# Patient Record
Sex: Male | Born: 1960 | Race: White | Hispanic: No | Marital: Married | Smoking: Never smoker
Health system: Southern US, Community
[De-identification: ages and names within clinical notes are randomized; demographics above are authoritative.]

## PROBLEM LIST (undated history)

## (undated) DIAGNOSIS — I7090 Unspecified atherosclerosis: Secondary | ICD-10-CM

## (undated) DIAGNOSIS — E7800 Pure hypercholesterolemia, unspecified: Secondary | ICD-10-CM

## (undated) HISTORY — DX: Unspecified atherosclerosis: I70.90

## (undated) HISTORY — PX: CERVICAL FUSION: SHX112

---

## 2009-05-24 ENCOUNTER — Ambulatory Visit: Payer: Self-pay | Admitting: Unknown Physician Specialty

## 2011-01-26 ENCOUNTER — Ambulatory Visit: Payer: Self-pay | Admitting: Internal Medicine

## 2017-01-12 ENCOUNTER — Ambulatory Visit
Admission: EM | Admit: 2017-01-12 | Discharge: 2017-01-12 | Disposition: A | Discharge status: Home / Self Care | Payer: BLUE CROSS/BLUE SHIELD | Attending: Emergency Medicine | Admitting: Emergency Medicine

## 2017-01-12 ENCOUNTER — Other Ambulatory Visit: Payer: Self-pay

## 2017-01-12 ENCOUNTER — Ambulatory Visit (INDEPENDENT_AMBULATORY_CARE_PROVIDER_SITE_OTHER): Payer: BLUE CROSS/BLUE SHIELD

## 2017-01-12 DIAGNOSIS — S92919A Unspecified fracture of unspecified toe(s), initial encounter for closed fracture: Secondary | ICD-10-CM

## 2017-01-12 DIAGNOSIS — S92511A Displaced fracture of proximal phalanx of right lesser toe(s), initial encounter for closed fracture: Secondary | ICD-10-CM | POA: Diagnosis not present

## 2017-01-12 DIAGNOSIS — S90222A Contusion of left lesser toe(s) with damage to nail, initial encounter: Secondary | ICD-10-CM

## 2017-01-12 HISTORY — DX: Pure hypercholesterolemia, unspecified: E78.00

## 2017-01-12 MED ORDER — HYDROCODONE-ACETAMINOPHEN 5-325 MG PO TABS: 1.0000 | ORAL_TABLET | Freq: Four times a day (QID) | ORAL | 0 refills | Status: DC | PRN

## 2017-01-12 MED ORDER — IBUPROFEN 600 MG PO TABS: 600.0000 mg | ORAL_TABLET | Freq: Four times a day (QID) | ORAL | 0 refills | Status: DC | PRN

## 2017-01-12 NOTE — ED Provider Notes (Signed)
HPI  SUBJECTIVE:  Dylan Holt is a 56 y.o. male who presents with right second toe pain, swelling, bruising after dropping a limb on it.  States that he was wearing boots.  He reports dull, constant, achy throbbing pain.  He reports deformity, swelling, but states the skin is intact.  He reports a subungual hematoma.  No numbness or tingling.  Symptoms are worse with walking, palpation, no alleviating factors.  He has not tried anything for this.  Past medical history negative for diabetes, hypertension. PMD: Marina GoodellFeldpausch, Dale E, MD   Past Medical History:  Diagnosis Date  . High cholesterol     Past Surgical History:  Procedure Laterality Date  . CERVICAL FUSION      History reviewed. No pertinent family history.  Social History   Tobacco Use  . Smoking status: Never Smoker  . Smokeless tobacco: Never Used  Substance Use Topics  . Alcohol use: Yes    Comment: social  . Drug use: No    No current facility-administered medications for this encounter.   Current Outpatient Medications:  .  rosuvastatin (CRESTOR) 10 MG tablet, Take 10 mg by mouth daily., Disp: , Rfl:  .  HYDROcodone-acetaminophen (NORCO/VICODIN) 5-325 MG tablet, Take 1-2 tablets by mouth every 6 (six) hours as needed for moderate pain., Disp: 20 tablet, Rfl: 0 .  ibuprofen (ADVIL,MOTRIN) 600 MG tablet, Take 1 tablet (600 mg total) by mouth every 6 (six) hours as needed., Disp: 30 tablet, Rfl: 0  No Known Allergies   ROS  As noted in HPI.   Physical Exam  BP 139/89 (BP Location: Right Arm)   Pulse 70   Temp 98.2 F (36.8 C) (Oral)   Resp 16   Ht 5\' 6"  (1.676 m)   Wt 140 lb (63.5 kg)   SpO2 99%   BMI 22.60 kg/m   Constitutional: Well developed, well nourished, no acute distress Eyes:  EOMI, conjunctiva normal bilaterally HENT: Normocephalic, atraumatic,mucus membranes moist Respiratory: Normal inspiratory effort Cardiovascular: Normal rate GI: nondistended skin: No rash, skin  intact Musculoskeletal: Positive tenderness distal phalanx of right second toe.  Positive subungual hematoma.  Positive bruising.  No limitation of motion.  Sensation grossly intact.  Cap refill less than 2 seconds.  See picture. no Rotational deformity when compared to other toe.   Neurologic: Alert & oriented x 3, no focal neuro deficits Psychiatric: Speech and behavior appropriate   ED Course   Medications - No data to display  Orders Placed This Encounter  Procedures  . DG Toe 2nd Right    Standing Status:   Standing    Number of Occurrences:   1    Order Specific Question:   Reason for Exam (SYMPTOM  OR DIAGNOSIS REQUIRED)    Answer:   pain  . DG Toe 2nd Right    Standing Status:   Standing    Number of Occurrences:   1    Order Specific Question:   Reason for Exam (SYMPTOM  OR DIAGNOSIS REQUIRED)    Answer:   eval for reduction  . Ambulatory referral to Podiatry    Referral Priority:   Urgent    Referral Type:   Consultation    Referral Reason:   Specialty Services Required    Requested Specialty:   Podiatry    Number of Visits Requested:   1  . Post op shoe    Standing Status:   Standing    Number of Occurrences:   1  Order Specific Question:   Laterality    Answer:   Right  . Buddy tape toes    Standing Status:   Standing    Number of Occurrences:   1    Order Specific Question:   Laterality    Answer:   Right    No results found for this or any previous visit (from the past 24 hour(s)). Dg Toe 2nd Right  Result Date: 01/12/2017 CLINICAL DATA:  Postreduction images of the fracture right second toe. EXAM: RIGHT SECOND TOE COMPARISON:  None. FINDINGS: The mild distal and dorsal displacement of the fracture of the distal phalanx of the right second toe is without change from the pre reduction images. IMPRESSION: No significant reduction of the mildly displaced fracture across the base of the distal tuft of the right second toe distal phalanx. Electronically Signed    By: Amie Portlandavid  Ormond M.D.   On: 01/12/2017 12:27   Dg Toe 2nd Right  Result Date: 01/12/2017 CLINICAL DATA:  Injury to toe this morning. Pain at distal phalanx of second toe. EXAM: RIGHT SECOND TOE COMPARISON:  None. FINDINGS: There is a transverse fracture across the distal phalanx of the right second toe, at the base of the distal tuft. Fracture is mildly displaced, distally by 2 mm and dorsally by a 1-2 mm. No other fractures.  The joints are normally spaced and aligned. IMPRESSION: Fracture of the distal phalanx of the right second toe. Electronically Signed   By: Amie Portlandavid  Ormond M.D.   On: 01/12/2017 11:25    ED Clinical Impression  Closed displaced fracture of distal phalanx of toe  Subungual hematoma of toe of left foot, initial encounter   ED Assessment/Plan  Reviewed imaging independently.  Displaced fracture of distal phalanx of right second toe.  See radiology report for full details.  Procedure note: Distracted distal phalanx and pressed toe down. Appeared to have good alignment post reduction.  Refill less than 2 seconds post procedure.  Obtaining post reduction films.   Post reduction films obtained.  No significant reduction.     [place] Narcotic database reviewed for this patient, and feel that the risk/benefit ratio today is favorable for proceeding with a prescription for controlled substance. No  Opiate prescriptions in 2 years.  discussed case with Dr. Ether GriffinsFowler, podiatry on call.  Advised buddy taping, stiff soled shoe, placed referral to podiatry.  Patient is to call the office on Monday and they will work him in. This may need yo be pinned.   Discussed imaging, MDM, plan and followup with patient. patient agrees with plan.   Meds ordered this encounter  Medications  . ibuprofen (ADVIL,MOTRIN) 600 MG tablet    Sig: Take 1 tablet (600 mg total) by mouth every 6 (six) hours as needed.    Dispense:  30 tablet    Refill:  0  . HYDROcodone-acetaminophen (NORCO/VICODIN) 5-325  MG tablet    Sig: Take 1-2 tablets by mouth every 6 (six) hours as needed for moderate pain.    Dispense:  20 tablet    Refill:  0    *This clinic note was created using Scientist, clinical (histocompatibility and immunogenetics)Dragon dictation software. Therefore, there may be occasional mistakes despite careful proofreading.   ?    Domenick GongMortenson, Dinero Chavira, MD 01/13/17 [phone]

## 2017-01-12 NOTE — Discharge Instructions (Signed)
Keep the toe buddy taped.  Wear the stiff soled shoe for protection and for comfort.  600 mg ibuprofen with 1 g of Tylenol 3-4 times a day as needed for pain.  Ice, elevate.  Norco for severe pain only.  Call Dr. Irene LimboFowler's office on Monday, he will wear nightly.

## 2017-01-12 NOTE — ED Triage Notes (Signed)
Pt with right 2nd toe pain after a limb fell on it while cutting branches this a[place]m. At [employer].

## 2017-08-10 ENCOUNTER — Other Ambulatory Visit: Payer: Self-pay

## 2017-08-10 ENCOUNTER — Ambulatory Visit
Admission: EM | Admit: 2017-08-10 | Discharge: 2017-08-10 | Disposition: A | Discharge status: Home / Self Care | Payer: BLUE CROSS/BLUE SHIELD | Attending: Family Medicine | Admitting: Family Medicine

## 2017-08-10 DIAGNOSIS — R05 Cough: Secondary | ICD-10-CM | POA: Diagnosis not present

## 2017-08-10 DIAGNOSIS — J0100 Acute maxillary sinusitis, unspecified: Secondary | ICD-10-CM | POA: Diagnosis not present

## 2017-08-10 DIAGNOSIS — R059 Cough, unspecified: Secondary | ICD-10-CM

## 2017-08-10 MED ORDER — BENZONATATE 100 MG PO CAPS: 100.0000 mg | ORAL_CAPSULE | Freq: Three times a day (TID) | ORAL | 0 refills | Status: DC | PRN

## 2017-08-10 MED ORDER — AMOXICILLIN-POT CLAVULANATE 875-125 MG PO TABS: 1.0000 | ORAL_TABLET | Freq: Two times a day (BID) | ORAL | 0 refills | Status: DC

## 2017-08-10 NOTE — Discharge Instructions (Signed)
Take medication as prescribed. Rest. Drink plenty of fluids.  ° °Follow up with your primary care physician this week as needed. Return to Urgent care for new or worsening concerns.  ° °

## 2017-08-10 NOTE — ED Triage Notes (Signed)
Patient complains of cough, congestion, sinus pain and pressure, ear pressure x 2 weeks. Patient states that he originally improved and then worsened again.

## 2017-08-10 NOTE — ED Provider Notes (Signed)
[facility] ____________________________________________  Time seen: Approximately 10:07 AM  I have reviewed the triage vital signs and the nursing notes.   HISTORY  Chief Complaint Cough  HPI Dylan Holt is a 57 y.o. male presenting for evaluation of 2.5 weeks of runny nose, nasal congestion and intermittent cough.  States he has been having some issues with seasonal allergies and has been taking Flonase and Claritin.  States has had increased nasal congestion with accompanying sinus pressure over the last 2 and half weeks.  States symptoms started to improve last week but reports of the last several days pressure has increased again and having postnasal drainage with intermittent cough.  Has not been taking any other over-the-counter medications.  Denies to eat and drink well.  Denies known fevers. Denies known sick contacts.  Reports otherwise feels well. Denies chest pain, shortness of breath, extremity pain, extremity swelling or rash. Denies recent sickness. Denies recent antibiotic use.    Past Medical History:  Diagnosis Date  . High cholesterol     There are no active problems to display for this patient.   Past Surgical History:  Procedure Laterality Date  . CERVICAL FUSION       No current facility-administered medications for this encounter.   Current Outpatient Medications:  .  ibuprofen (ADVIL,MOTRIN) 600 MG tablet, Take 1 tablet (600 mg total) by mouth every 6 (six) hours as needed., Disp: 30 tablet, Rfl: 0 .  rosuvastatin (CRESTOR) 10 MG tablet, Take 10 mg by mouth daily., Disp: , Rfl:  .  amoxicillin-clavulanate (AUGMENTIN) 875-125 MG tablet, Take 1 tablet by mouth every 12 (twelve) hours., Disp: 20 tablet, Rfl: 0 .  benzonatate (TESSALON PERLES) 100 MG capsule, Take 1 capsule (100 mg total) by mouth 3 (three) times daily as needed., Disp: 15 capsule, Rfl: 0  Allergies Patient has no known allergies.  Family History  Problem Relation Age of  Onset  . Heart attack Father   . Lung cancer Father     Social History Social History   Tobacco Use  . Smoking status: Never Smoker  . Smokeless tobacco: Never Used  Substance Use Topics  . Alcohol use: Yes    Comment: social  . Drug use: No    Review of Systems Constitutional: No fever/chills ENT: No sore throat. As above.  Cardiovascular: Denies chest pain. Respiratory: Denies shortness of breath. Gastrointestinal: No abdominal pain.  Musculoskeletal: Negative for back pain. Skin: Negative for rash.  ____________________________________________   PHYSICAL EXAM:  VITAL SIGNS: ED Triage Vitals  Enc Vitals Group     BP 08/10/17 0931 113/81     Pulse Rate 08/10/17 0931 70     Resp 08/10/17 0931 16     Temp 08/10/17 0931 98.6 F (37 C)     Temp Source 08/10/17 0931 Oral     SpO2 08/10/17 0931 99 %     Weight 08/10/17 0928 145 lb (65.8 kg)     Height 08/10/17 0928 5\' 6"  (1.676 m)     Head Circumference --      Peak Flow --      Pain Score 08/10/17 0928 3     Pain Loc --      Pain Edu? --      Excl. in GC? --     Constitutional: Alert and oriented. Well appearing and in no acute distress. Eyes: Conjunctivae are normal. Head: Atraumatic.Mild to moderate tenderness to palpation bilateral frontal and maxillary sinuses. No swelling. No erythema.  Ears:Left: Nontender, normal canal, no erythema, mild effusion present, otherwise normal TM.  Right: Nontender, normal canal, no erythema, normal TM.  Nose:Nasal congestion   Mouth/Throat: Mucous membranes are moist. Oropharynx non-erythematous. No tonsillar swelling or exudate.  Neck: No stridor.  No cervical spine tenderness to palpation. Hematological/Lymphatic/Immunilogical: No cervical lymphadenopathy. Cardiovascular: Normal rate, regular rhythm. Grossly normal heart sounds. Good peripheral circulation. Respiratory: Normal respiratory effort.  No retractions. No wheezes, rales or rhonchi. Good air movement.    Musculoskeletal: Steady gait.  Neurologic:  Normal speech and language. No gait instability. Skin:  Skin is warm, dry and intact. No rash noted. Psychiatric: Mood and affect are normal. Speech and behavior are normal.  ___________________________________________   LABS (all labs ordered are listed, but only abnormal results are displayed)  Labs Reviewed - No data to display   PROCEDURES Procedures   INITIAL IMPRESSION / ASSESSMENT AND PLAN / ED COURSE  Pertinent labs & imaging results that were available during my care of the patient were reviewed by me and considered in my medical decision making (see chart for details).  Well-appearing patient.  No acute distress.  Suspect recent viral upper respiratory infection versus allergic rhinitis with secondary sinusitis and cough.  Will treat with oral Augmentin and PRN Tessalon Perles.  Encourage rest, fluids, supportive care, continue Claritin and Flonase.  Discussed in detail with patient if continues to have left ear hearing changes, follow-up with ENT recommended.Discussed indication, risks and benefits of medications with patient.  Discussed follow up with Primary care physician this week. Discussed follow up and return parameters including no resolution or any worsening concerns. Patient verbalized understanding and agreed to plan.   ____________________________________________   FINAL CLINICAL IMPRESSION(S) / ED DIAGNOSES  Final diagnoses:  Acute maxillary sinusitis, recurrence not specified  Cough     ED Discharge Orders        Ordered    amoxicillin-clavulanate (AUGMENTIN) 875-125 MG tablet  Every 12 hours     08/10/17 0957    benzonatate (TESSALON PERLES) 100 MG capsule  3 times daily PRN     08/10/17 0959       Note: This dictation was prepared with Dragon dictation along with smaller phrase technology. Any transcriptional errors that result from this process are unintentional.         Renford DillsMiller, Andras Grunewald,  NP 08/10/17 1032

## 2017-11-04 ENCOUNTER — Other Ambulatory Visit: Payer: Self-pay | Admitting: Otolaryngology

## 2017-11-04 DIAGNOSIS — H90A22 Sensorineural hearing loss, unilateral, left ear, with restricted hearing on the contralateral side: Secondary | ICD-10-CM

## 2017-11-08 ENCOUNTER — Ambulatory Visit
Admission: RE | Admit: 2017-11-08 | Discharge: 2017-11-08 | Disposition: A | Discharge status: Home / Self Care | Payer: BLUE CROSS/BLUE SHIELD | Source: Ambulatory Visit | Attending: Otolaryngology | Admitting: Otolaryngology

## 2017-11-08 DIAGNOSIS — H9192 Unspecified hearing loss, left ear: Secondary | ICD-10-CM | POA: Insufficient documentation

## 2017-11-08 DIAGNOSIS — H90A22 Sensorineural hearing loss, unilateral, left ear, with restricted hearing on the contralateral side: Secondary | ICD-10-CM

## 2017-11-08 MED ORDER — GADOBENATE DIMEGLUMINE 529 MG/ML IV SOLN
13.0000 mL | Freq: Once | INTRAVENOUS | Status: AC | PRN
  Administered 2017-11-08: 13 mL via INTRAVENOUS

## 2019-01-12 IMAGING — CR DG TOE 2ND 2+V*R*
3 series · 3 of 3 positions shown · non-contrast
Comparison: None.

CLINICAL DATA: Injury to toe this morning. Pain at distal phalanx
of second toe.

EXAM:
RIGHT SECOND TOE

[toe ap]
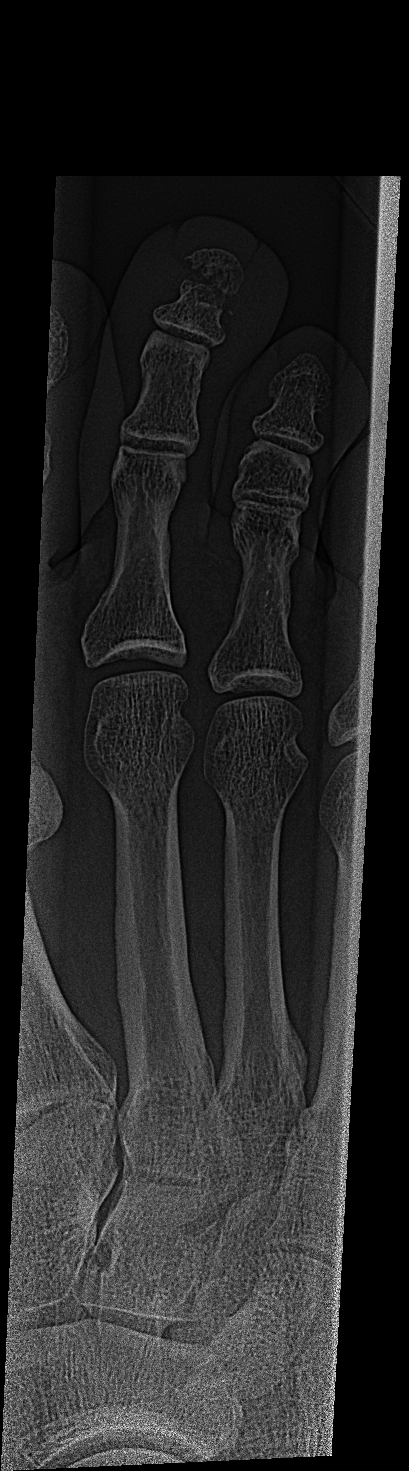

[toe obl]
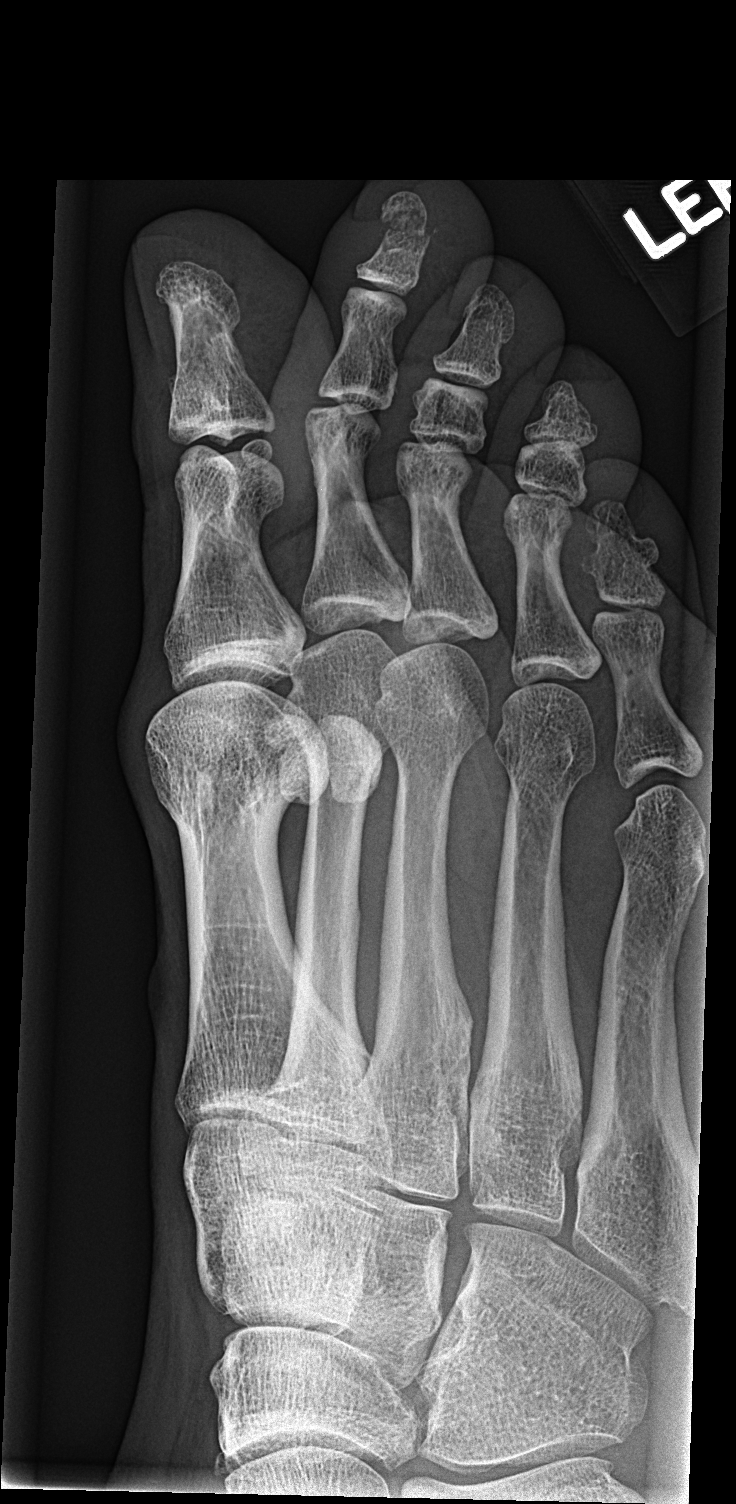

[toe lat]
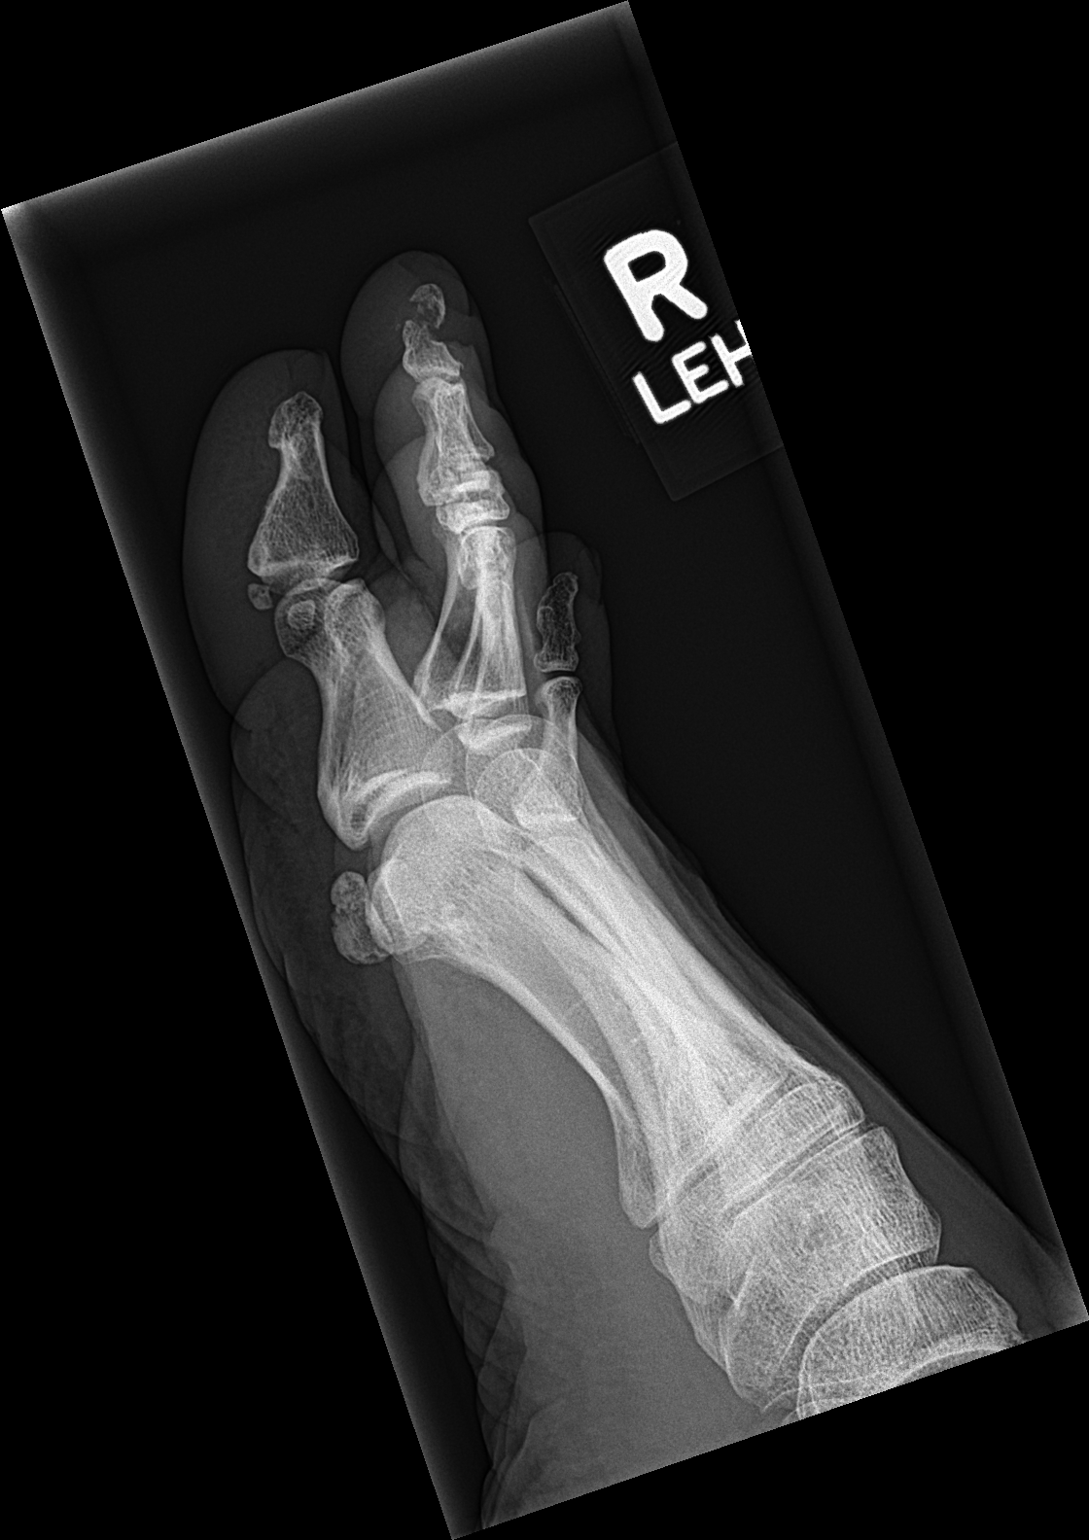

[3 of 3 positions shown; findings below may reference images not displayed]

FINDINGS: There is a transverse fracture across the distal phalanx of the
right second toe, at the base of the distal tuft. Fracture is mildly
displaced, distally by 2 mm and dorsally by a 1-2 mm.

No other fractures.  The joints are normally spaced and aligned.
IMPRESSION: Fracture of the distal phalanx of the right second toe.

## 2019-01-12 IMAGING — CR DG TOE 2ND 2+V*R*
3 series · 3 of 3 positions shown · non-contrast
Comparison: None.

CLINICAL DATA: Postreduction images of the fracture right second
toe.

EXAM:
RIGHT SECOND TOE

[toe ap]
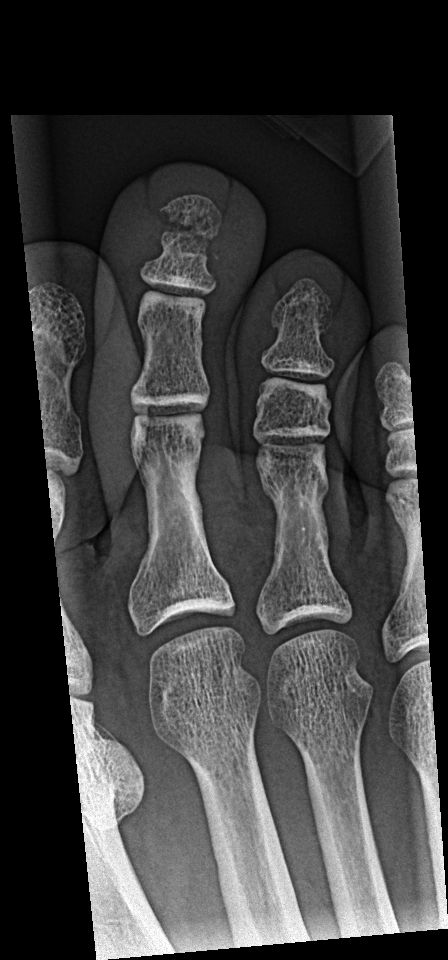

[toe obl]
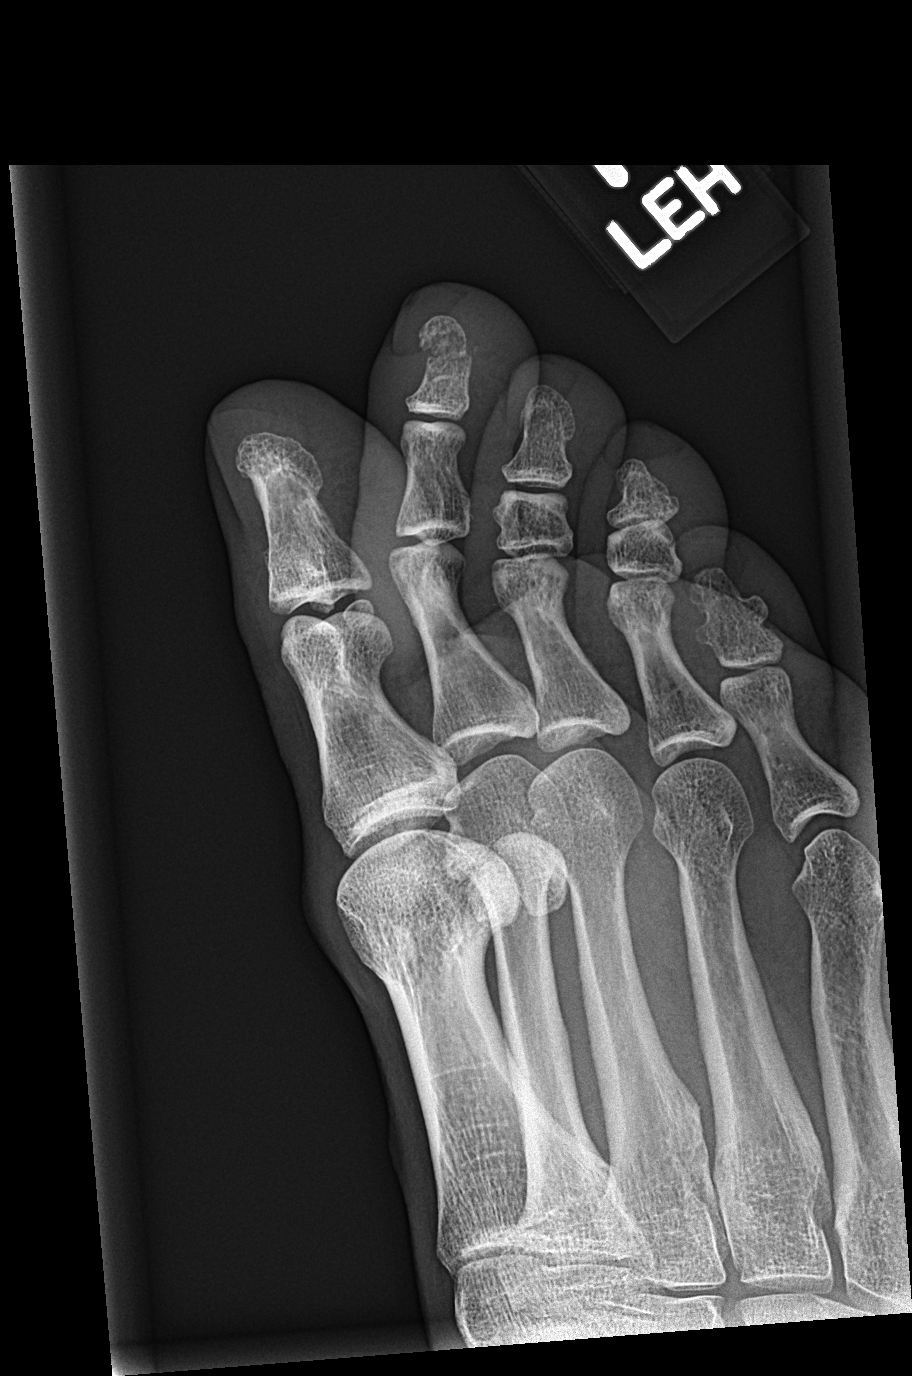

[toe lat]
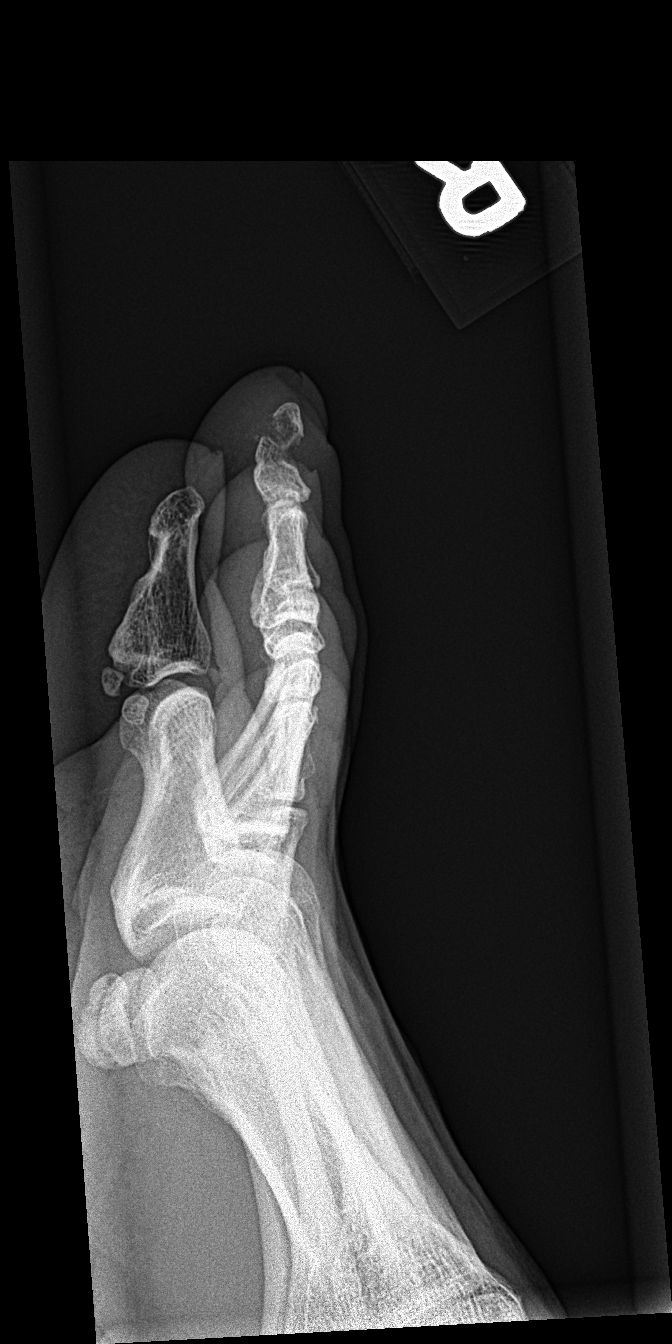

[3 of 3 positions shown; findings below may reference images not displayed]

FINDINGS: The mild distal and dorsal displacement of the fracture of the
distal phalanx of the right second toe is without change from the
pre reduction images.
IMPRESSION: No significant reduction of the mildly displaced fracture across the
base of the distal tuft of the right second toe distal phalanx.

## 2021-06-06 DIAGNOSIS — R17 Unspecified jaundice: Secondary | ICD-10-CM | POA: Diagnosis not present

## 2021-06-06 DIAGNOSIS — Z1329 Encounter for screening for other suspected endocrine disorder: Secondary | ICD-10-CM | POA: Diagnosis not present

## 2021-06-06 DIAGNOSIS — H905 Unspecified sensorineural hearing loss: Secondary | ICD-10-CM | POA: Diagnosis not present

## 2021-06-06 DIAGNOSIS — Z0000 Encounter for general adult medical examination without abnormal findings: Secondary | ICD-10-CM | POA: Diagnosis not present

## 2021-06-06 DIAGNOSIS — Z125 Encounter for screening for malignant neoplasm of prostate: Secondary | ICD-10-CM | POA: Diagnosis not present

## 2021-06-06 DIAGNOSIS — E782 Mixed hyperlipidemia: Secondary | ICD-10-CM | POA: Diagnosis not present

## 2021-06-06 DIAGNOSIS — R7303 Prediabetes: Secondary | ICD-10-CM | POA: Diagnosis not present

## 2021-10-04 ENCOUNTER — Ambulatory Visit: Payer: BC Managed Care – PPO | Attending: Interventional Cardiology | Admitting: Interventional Cardiology

## 2021-10-04 ENCOUNTER — Encounter: Payer: Self-pay | Admitting: Interventional Cardiology

## 2021-10-04 VITALS — BP 110/70 | HR 60 | Ht 66.0 in | Wt 141.0 lb

## 2021-10-04 DIAGNOSIS — K219 Gastro-esophageal reflux disease without esophagitis: Secondary | ICD-10-CM

## 2021-10-04 DIAGNOSIS — E785 Hyperlipidemia, unspecified: Secondary | ICD-10-CM | POA: Diagnosis not present

## 2021-10-04 DIAGNOSIS — I2584 Coronary atherosclerosis due to calcified coronary lesion: Secondary | ICD-10-CM

## 2021-10-04 DIAGNOSIS — I2510 Atherosclerotic heart disease of native coronary artery without angina pectoris: Secondary | ICD-10-CM | POA: Diagnosis not present

## 2021-10-04 NOTE — Patient Instructions (Signed)
Medication Instructions:  Your physician recommends that you continue on your current medications as directed. Please refer to the Current Medication list given to you today.  *If you need a refill on your cardiac medications before your next appointment, please call your pharmacy*   Lab Work: Your physician recommends that you return for lab work ion November 24, 2021.  Lipid and liver profiles.  This will be fasting.  The lab opens at 7:15 AM  If you have labs (blood work) drawn today and your tests are completely normal, you will receive your results only by: MyChart Message (if you have MyChart) OR A paper copy in the mail If you have any lab test that is abnormal or we need to change your treatment, we will call you to review the results.   Testing/Procedures: none   Follow-Up: At [facility], you and your health needs are our priority.  As part of our continuing mission to provide you with exceptional heart care, we have created designated Provider Care Teams.  These Care Teams include your primary Cardiologist (physician) and Advanced Practice Providers (APPs -  Physician Assistants and Nurse Practitioners) who all work together to provide you with the care you need, when you need it.  We recommend signing up for the patient portal called "MyChart".  Sign up information is provided on this After Visit Summary.  MyChart is used to connect with patients for Virtual Visits (Telemedicine).  Patients are able to view lab/test results, encounter notes, upcoming appointments, etc.  Non-urgent messages can be sent to your provider as well.   To learn more about what you can do with MyChart, go to [email or web address].    Your next appointment:   12 month(s)  The format for your next appointment:   In Person  Provider:   Lance Muss, MD     Other Instructions    Important Information About Sugar

## 2021-10-04 NOTE — Progress Notes (Signed)
Cardiology Office Note   Date:  10/04/2021   ID:  Dylan Holt, Dylan Holt 1960-02-09, MRN [id number]  PCP:  Marina Goodell, MD    No chief complaint on file.  Coronary calcification  Wt Readings from Last 3 Encounters:  10/04/21 141 lb (64 kg)  08/10/17 145 lb (65.8 kg)  01/12/17 140 lb (63.5 kg)       History of Present Illness: Dylan Holt is a 61 y.o. male who is being seen today for the evaluation of atherosclerosis at the request of Luciana Axe, NP.   Brother of my patient, Venetia Maxon Tallie  2014 eval showed PACs.  He had a stress echo at that time.  He was treated with omeprazole for GERD and sx resolved. No significant CAD detected.   He has seen a cardiologist in [place] in the past.  He has risk factors including hyperlipidemia and prediabetes.  He has been on Crestor 10 mg daily.  Healthy lifestyle has been encouraged as well.  He apparently had a CT scan which showed atherosclerosis.  Referral shows:  High calcium score.  Screening CT body scan.  August 17, 2021. Total calcium score 863, left main 4, LAD 561, left circumflex 225, RCA 73.  80th percentile.  Denies : Chest pain. Dizziness. Leg edema. Nitroglycerin use. Orthopnea. Palpitations. Paroxysmal nocturnal dyspnea. Shortness of breath. Syncope.    Walks regularly at work.  He is a International aid/development worker.  No dedicated walking.  He does do some exercise with a kettle bell and does some push-ups as well.  No cardiac symptoms with those activities.  Oldest brother with a stent.   Past Medical History:  Diagnosis Date   Atherosclerosis    High cholesterol     Past Surgical History:  Procedure Laterality Date   CERVICAL FUSION       Current Outpatient Medications  Medication Sig Dispense Refill   rosuvastatin (CRESTOR) 10 MG tablet Take 10 mg by mouth daily.     No current facility-administered medications for this visit.    Allergies:   Patient has no known allergies.    Social History:   The patient  reports that he has never smoked. He has never used smokeless tobacco. He reports current alcohol use. He reports that he does not use drugs.   Family History:  The patient's family history includes Heart attack in his father; Lung cancer in his father.    ROS:  Please see the history of present illness.   Otherwise, review of systems are positive for not exercising.   All other systems are reviewed and negative.    PHYSICAL EXAM: VS:  BP 110/70 (BP Location: Left Arm, Patient Position: Sitting, Cuff Size: Normal)   Pulse 60   Ht 5\' 6"  (1.676 m)   Wt 141 lb (64 kg)   SpO2 98%   BMI 22.76 kg/m  , BMI Body mass index is 22.76 kg/m. GEN: Well nourished, well developed, in no acute distress HEENT: normal Neck: no JVD, carotid bruits, or masses Cardiac: RRR; no murmurs, rubs, or gallops,no edema  Respiratory:  clear to auscultation bilaterally, normal work of breathing GI: soft, nontender, nondistended, + BS MS: no deformity or atrophy Skin: warm and dry, no rash Neuro:  Strength and sensation are intact Psych: euthymic mood, full affect   EKG:   The ekg ordered today demonstrates NSR, IRBBB   Recent Labs: No results found for requested labs within last 365 days.   Lipid  Panel No results found for: "CHOL", "TRIG", "HDL", "CHOLHDL", "VLDL", "LDLCALC", "LDLDIRECT"   Other studies Reviewed: Additional studies/ records that were reviewed today with results demonstrating: labs reviewed; LDL 67 in 5/23.   ASSESSMENT AND PLAN:  Coronary calcium: Start 81 mg aspirin daily along with the Crestor 10 mg daily.  Consider the idea of a high potency statin, namely 20 mg of rosuvastatin daily for more aggressive prevention depending on upcoming blood work.   GERD: uses OTC omeprazole on occasion, Gaviscon. Trying to see if melatonin helps improve esophageal sphincter tone. Increase exercise to target below.  He is following a healthy diet.  If he has any change in exercise  capacity, or difficulty increasing to the target below, he will let us know.  We will consider a stress test at that time.  Current medicines are reviewed at length with the patient today.  The patient concerns regarding his medicines were addressed.  The following changes have been made:  No change  Labs/ tests ordered today include:  No orders of the defined types were placed in this encounter.   Recommend 150 minutes/week of aerobic exercise Low fat, low carb, high fiber diet recommended  Disposition:   FU in 1 year   Signed, Lance Muss, MD  10/04/2021 9:29 AM    [facility] [address] [place], [place], [place]  [address] Phone: [phone]; Fax: [phone]

## 2021-10-06 ENCOUNTER — Ambulatory Visit: Payer: BLUE CROSS/BLUE SHIELD | Admitting: Interventional Cardiology

## 2021-10-11 DIAGNOSIS — H90A22 Sensorineural hearing loss, unilateral, left ear, with restricted hearing on the contralateral side: Secondary | ICD-10-CM | POA: Diagnosis not present

## 2021-10-31 DIAGNOSIS — H903 Sensorineural hearing loss, bilateral: Secondary | ICD-10-CM | POA: Diagnosis not present

## 2021-11-17 DIAGNOSIS — H903 Sensorineural hearing loss, bilateral: Secondary | ICD-10-CM | POA: Diagnosis not present

## 2021-11-24 ENCOUNTER — Ambulatory Visit: Payer: BC Managed Care – PPO | Attending: Interventional Cardiology

## 2021-11-24 DIAGNOSIS — E785 Hyperlipidemia, unspecified: Secondary | ICD-10-CM | POA: Diagnosis not present

## 2021-11-24 LAB — LIPID PANEL
Chol/HDL Ratio: 2.5 ratio (ref 0.0–5.0)
Cholesterol, Total: 150 mg/dL (ref 100–199)
HDL: 59 mg/dL (ref >39)
LDL Chol Calc (NIH): 76 mg/dL (ref 0–99)
Triglycerides: 76 mg/dL (ref 0–149)
VLDL Cholesterol Cal: 15 mg/dL (ref 5–40)

## 2021-11-24 LAB — HEPATIC FUNCTION PANEL
ALT: 16 IU/L (ref 0–44)
AST: 18 IU/L (ref 0–40)
Albumin: 4.7 g/dL (ref 3.9–4.9)
Alkaline Phosphatase: 90 IU/L (ref 44–121)
Bilirubin Total: 1 mg/dL (ref 0.0–1.2)
Bilirubin, Direct: 0.24 mg/dL (ref 0.00–0.40)
Total Protein: 6.5 g/dL (ref 6.0–8.5)

## 2021-11-27 ENCOUNTER — Telehealth: Payer: Self-pay | Admitting: *Deleted

## 2021-11-27 DIAGNOSIS — E785 Hyperlipidemia, unspecified: Secondary | ICD-10-CM

## 2021-11-27 DIAGNOSIS — I2510 Atherosclerotic heart disease of native coronary artery without angina pectoris: Secondary | ICD-10-CM

## 2021-11-27 MED ORDER — ROSUVASTATIN CALCIUM 20 MG PO TABS: 20.0000 mg | ORAL_TABLET | Freq: Every day | ORAL | 3 refills | Status: DC

## 2021-11-27 NOTE — Telephone Encounter (Signed)
-----   Message from Jettie Booze, MD sent at 11/24/2021  7:47 PM EDT ----- Given calcium score, would like to see LDL less than 70.  Increase rosuvastatin to 20 mg daily.  Repeat liver and lipid tests in 3 months.

## 2021-11-27 NOTE — Telephone Encounter (Signed)
Left message to call office

## 2021-11-27 NOTE — Telephone Encounter (Signed)
Patient notified.  Prescription sent to [facility] in [place]. Patient will come in for fasting lab work on February 23, 2022

## 2022-02-23 ENCOUNTER — Ambulatory Visit: Payer: BC Managed Care – PPO | Attending: Interventional Cardiology

## 2022-02-23 DIAGNOSIS — I2584 Coronary atherosclerosis due to calcified coronary lesion: Secondary | ICD-10-CM | POA: Diagnosis not present

## 2022-02-23 DIAGNOSIS — I2510 Atherosclerotic heart disease of native coronary artery without angina pectoris: Secondary | ICD-10-CM

## 2022-02-23 DIAGNOSIS — E785 Hyperlipidemia, unspecified: Secondary | ICD-10-CM

## 2022-02-23 LAB — HEPATIC FUNCTION PANEL
ALT: 17 IU/L (ref 0–44)
AST: 20 IU/L (ref 0–40)
Albumin: 4.5 g/dL (ref 3.9–4.9)
Alkaline Phosphatase: 83 IU/L (ref 44–121)
Bilirubin Total: 0.8 mg/dL (ref 0.0–1.2)
Bilirubin, Direct: 0.25 mg/dL (ref 0.00–0.40)
Total Protein: 6.3 g/dL (ref 6.0–8.5)

## 2022-02-23 LAB — LIPID PANEL
Chol/HDL Ratio: 2.3 ratio (ref 0.0–5.0)
Cholesterol, Total: 141 mg/dL (ref 100–199)
HDL: 62 mg/dL (ref >39)
LDL Chol Calc (NIH): 64 mg/dL (ref 0–99)
Triglycerides: 78 mg/dL (ref 0–149)
VLDL Cholesterol Cal: 15 mg/dL (ref 5–40)

## 2022-05-09 DIAGNOSIS — H5213 Myopia, bilateral: Secondary | ICD-10-CM | POA: Diagnosis not present

## 2022-10-14 ENCOUNTER — Other Ambulatory Visit: Payer: Self-pay | Admitting: Interventional Cardiology

## 2022-12-06 NOTE — Progress Notes (Signed)
Cardiology Office Note:  .   Date:  12/07/2022  ID:  Dylan Holt, DOB 1960/07/09, MRN [id number] PCP: Marina Goodell, MD  [facility] Providers Cardiologist:  Lance Muss, MD {  History of Present Illness: .   Dylan Holt is a 62 y.o. male with a past medical history of atherosclerosis and PACs here for follow-up appointment.  2014 evaluation showed PACs.  Stress echo at that time.  He was treated with omeprazole for GERD and symptoms resolved.  No significant CAD detected.  He was seen by a cardiologist in [place] in the past.  Has risk factors including hyperlipidemia and prediabetes.  Has been on Crestor 10 mg daily.  Healthy lifestyle has been encouraged as well.  He had a CT scan which showed atherosclerosis.  Referral shows: High calcium score.  Screening CT body scan.  August 17, 2021 he had a calcium score of 863, left main 4, LAD 561, left circumflex 225, RCA 73.  80th percentile.  Luckily, denies chest pain, dizziness, leg edema, nitroglycerin use, orthopnea, palpitations, paroxysmal nocturnal dyspnea, shortness of breath, and syncope at that time.  When he was last seen 10/04/2021 he was walking regularly.  He is a Biomedical scientist.  No dedicated walking outside to work.  He does do some exercise with a kettle bell does not push up as well.  No cardiac symptoms with his activities.  Was brother has a stent.  Today, he presents for a routine follow-up. He reports no new health concerns since his last visit in August 2023. He denies chest pain and shortness of breath, and continues to maintain an active lifestyle, including regular exercise with a kettlebell. 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  He  has a family history of coronary disease, with two brothers having had stents placed and their father having had a heart attack at age 35, followed by a bypass surgery that lasted for over 30 years.  Their father was a smoker and eventually died of lung cancer. The patient has also experienced heart fluttering in the past, which was determined to be due to gastroesophageal reflux disease (GERD).   The patient has been taking Crestor 20 for cholesterol management, with his last LDL level at 64 in January. He denies any side effects from the Crestor, although he does experience morning soreness in his fingers, which he attributes to his surgical work and age rather than the medication. He also reports silent reflux, which he manages by avoiding certain foods  Reports no shortness of breath nor dyspnea on exertion. Reports no chest pain, pressure, or tightness. No edema, orthopnea, PND. Reports no palpitations.    ROS: Pertinent ROS in HPI  Studies Reviewed: Marland Kitchen        Coronary CTA with calcium score reviewed.       Physical Exam:   VS:  BP 120/62   Pulse (!) 59   Ht 5\' 6"  (1.676 m)   Wt 146 lb 9.6 oz (66.5 kg)   SpO2 99%   BMI 23.66 kg/m    Wt Readings from Last 3 Encounters:  12/07/22 146 lb 9.6 oz (66.5 kg)  10/04/21 141 lb (64 kg)  08/10/17 145 lb (65.8 kg)    GEN: Well nourished, well developed in no acute distress NECK: No JVD; No carotid bruits CARDIAC: RRR, no murmurs, rubs, gallops RESPIRATORY:  Clear to auscultation without rales, wheezing or rhonchi  ABDOMEN:  Soft, non-tender, non-distended EXTREMITIES:  No edema; No deformity   ASSESSMENT AND PLAN: .    HLD -Crestor 20mg , continue -Lipid panel and LFTs  Coronary Artery Disease No chest pain or shortness of breath. Active lifestyle with regular exercise. Family history of coronary disease. Elevated calcium score on previous screening CT. Last LDL was 64 in January. -Continue Crestor 20mg  daily. -Check lipid panel and liver function tests today.  Gastroesophageal Reflux Disease Previous episodes of heart flutter attributed to reflux. No current symptoms. -Continue current management.  General Health  Maintenance -Continue active lifestyle and work as a International aid/development worker.      Dispo: He can follow-up in a year.   Signed, Sharlene Dory, PA-C

## 2022-12-07 ENCOUNTER — Encounter: Payer: Self-pay | Admitting: Physician Assistant

## 2022-12-07 ENCOUNTER — Ambulatory Visit: Payer: BC Managed Care – PPO | Attending: Physician Assistant | Admitting: Physician Assistant

## 2022-12-07 VITALS — BP 120/62 | HR 59 | Ht 66.0 in | Wt 146.6 lb

## 2022-12-07 DIAGNOSIS — E785 Hyperlipidemia, unspecified: Secondary | ICD-10-CM

## 2022-12-07 DIAGNOSIS — K219 Gastro-esophageal reflux disease without esophagitis: Secondary | ICD-10-CM | POA: Diagnosis not present

## 2022-12-07 DIAGNOSIS — Z09 Encounter for follow-up examination after completed treatment for conditions other than malignant neoplasm: Secondary | ICD-10-CM | POA: Diagnosis not present

## 2022-12-07 NOTE — Patient Instructions (Signed)
Medication Instructions:   Your physician recommends that you continue on your current medications as directed. Please refer to the Current Medication list given to you today.   *If you need a refill on your cardiac medications before your next appointment, please call your pharmacy*   Lab Work:  TODAY!!!!! LIPID/LFT  If you have labs (blood work) drawn today and your tests are completely normal, you will receive your results only by: MyChart Message (if you have MyChart) OR A paper copy in the mail If you have any lab test that is abnormal or we need to change your treatment, we will call you to review the results.   Testing/Procedures:  None ordered.    Follow-Up: At [facility], you and your health needs are our priority.  As part of our continuing mission to provide you with exceptional heart care, we have created designated Provider Care Teams.  These Care Teams include your primary Cardiologist (physician) and Advanced Practice Providers (APPs -  Physician Assistants and Nurse Practitioners) who all work together to provide you with the care you need, when you need it.  We recommend signing up for the patient portal called "MyChart".  Sign up information is provided on this After Visit Summary.  MyChart is used to connect with patients for Virtual Visits (Telemedicine).  Patients are able to view lab/test results, encounter notes, upcoming appointments, etc.  Non-urgent messages can be sent to your provider as well.   To learn more about what you can do with MyChart, go to [email or web address].    Your next appointment:   1 year(s)  Provider:   Jari Favre, PA-C         Other Instructions  Your physician wants you to follow-up in: 1 year.  You will receive a reminder letter in the mail two months in advance. If you don't receive a letter, please call our office to schedule the follow-up appointment.

## 2022-12-08 LAB — HEPATIC FUNCTION PANEL
ALT: 23 [IU]/L (ref 0–44)
AST: 28 [IU]/L (ref 0–40)
Albumin: 4.6 g/dL (ref 3.9–4.9)
Alkaline Phosphatase: 78 [IU]/L (ref 44–121)
Bilirubin Total: 0.9 mg/dL (ref 0.0–1.2)
Bilirubin, Direct: 0.22 mg/dL (ref 0.00–0.40)
Total Protein: 6.6 g/dL (ref 6.0–8.5)

## 2022-12-08 LAB — LIPID PANEL
Chol/HDL Ratio: 2.5 ratio (ref 0.0–5.0)
Cholesterol, Total: 154 mg/dL (ref 100–199)
HDL: 62 mg/dL (ref >39)
LDL Chol Calc (NIH): 77 mg/dL (ref 0–99)
Triglycerides: 77 mg/dL (ref 0–149)
VLDL Cholesterol Cal: 15 mg/dL (ref 5–40)

## 2022-12-12 ENCOUNTER — Telehealth: Payer: Self-pay | Admitting: Physician Assistant

## 2022-12-12 DIAGNOSIS — E785 Hyperlipidemia, unspecified: Secondary | ICD-10-CM

## 2022-12-12 MED ORDER — ROSUVASTATIN CALCIUM 40 MG PO TABS: 40.0000 mg | ORAL_TABLET | Freq: Every day | ORAL | 3 refills | Status: AC

## 2022-12-12 NOTE — Telephone Encounter (Signed)
The patient has been notified of the result and verbalized understanding.  All questions (if any) were answered. Ethelda Chick, RN 12/12/2022 2:10 PM   Placed orders.

## 2022-12-12 NOTE — Telephone Encounter (Signed)
Mr. Dylan Holt,   Since you have a history of coronary artery disease your LDL goal is less than 70 (he more recent recommendations less than 55) this years was 9.  Your triglycerides are at goal.  We need to increase your Crestor to 40 mg daily and recheck a lipid panel in about 8 weeks.   Thanks Sharlene Dory, PA-C    Left message for patient to call back.

## 2022-12-12 NOTE — Telephone Encounter (Signed)
Pt returning nurses phone call regarding results. Please advise.

## 2023-02-15 ENCOUNTER — Other Ambulatory Visit: Payer: Self-pay

## 2023-02-15 DIAGNOSIS — E785 Hyperlipidemia, unspecified: Secondary | ICD-10-CM | POA: Diagnosis not present

## 2023-02-16 LAB — LIPID PANEL
Chol/HDL Ratio: 2.2 {ratio} (ref 0.0–5.0)
Cholesterol, Total: 148 mg/dL (ref 100–199)
HDL: 66 mg/dL (ref >39)
LDL Chol Calc (NIH): 69 mg/dL (ref 0–99)
Triglycerides: 66 mg/dL (ref 0–149)
VLDL Cholesterol Cal: 13 mg/dL (ref 5–40)
